# Patient Record
Sex: Female | Born: 1960 | Race: White | Hispanic: No | Marital: Married | Smoking: Never smoker
Health system: Southern US, Community
[De-identification: ages and names within clinical notes are randomized; demographics above are authoritative.]

## PROBLEM LIST (undated history)

## (undated) DIAGNOSIS — G510 Bell's palsy: Secondary | ICD-10-CM

## (undated) HISTORY — DX: Bell's palsy: G51.0

---

## 2008-05-29 ENCOUNTER — Other Ambulatory Visit: Admission: RE | Admit: 2008-05-29 | Discharge: 2008-05-29 | Payer: Self-pay | Admitting: Obstetrics and Gynecology

## 2008-11-06 ENCOUNTER — Other Ambulatory Visit: Admission: RE | Admit: 2008-11-06 | Discharge: 2008-11-06 | Payer: Self-pay | Admitting: Obstetrics and Gynecology

## 2008-12-07 ENCOUNTER — Encounter: Admission: RE | Admit: 2008-12-07 | Discharge: 2008-12-07 | Payer: Self-pay | Admitting: Obstetrics and Gynecology

## 2009-06-05 ENCOUNTER — Other Ambulatory Visit: Admission: RE | Admit: 2009-06-05 | Discharge: 2009-06-05 | Payer: Self-pay | Admitting: Obstetrics and Gynecology

## 2009-12-05 ENCOUNTER — Other Ambulatory Visit: Admission: RE | Admit: 2009-12-05 | Discharge: 2009-12-05 | Payer: Self-pay | Admitting: Obstetrics and Gynecology

## 2009-12-18 ENCOUNTER — Encounter: Admission: RE | Admit: 2009-12-18 | Discharge: 2009-12-18 | Payer: Self-pay | Admitting: Obstetrics and Gynecology

## 2010-11-17 ENCOUNTER — Encounter: Payer: Self-pay | Admitting: Family Medicine

## 2010-11-17 ENCOUNTER — Ambulatory Visit (INDEPENDENT_AMBULATORY_CARE_PROVIDER_SITE_OTHER): Payer: BC Managed Care – PPO | Admitting: Family Medicine

## 2010-11-17 VITALS — BP 117/78 | HR 64 | Ht 67.5 in | Wt 134.0 lb

## 2010-11-17 DIAGNOSIS — M7710 Lateral epicondylitis, unspecified elbow: Secondary | ICD-10-CM

## 2010-11-17 MED ORDER — NITROGLYCERIN 0.2 MG/HR TD PT24: MEDICATED_PATCH | TRANSDERMAL | Status: DC

## 2010-11-17 NOTE — Patient Instructions (Signed)
You have "tennis elbow" --also known as lateral epicondylitis.  We will start you on the nitro patches as we discussed. I would not play tennis until we see you back in 2-3 weeks. I have called the patch into you pharmacy. Please call us with questions or issues in the interim. The instructions for the patch are: Cut patch into one - fourth pieces. Place a one fourth piece of patch on  skin over affected area, changing to a new piece every 24 hours.

## 2010-11-18 DIAGNOSIS — M7710 Lateral epicondylitis, unspecified elbow: Secondary | ICD-10-CM | POA: Insufficient documentation

## 2010-11-18 NOTE — Progress Notes (Signed)
  Subjective:    Patient ID: Valerie Stephens, female    DOB: [birth year], 50 y.o.   MRN: [id number]  HPI  Right lateral elbow pain for 5-6 weeks. Can isolate the specific day it started. Was playing with a college player, he was very windy and they were both heating extremely hard. Toward the end of the work that she started having some elbow pain after she stopped it got worse. She did apply for a few days and then tried it again and was unable to play. She has now taken off approximately 5 weeks without tenderness and it has not improved. She tried to play for 10 minutes day before yesterday was unable. She has never had prior history of any elbow problems and has had no right upper extremity  surgeries.  Review of Systems    denies bruising of the elbow, no numbness or tingling in the arm or hand. Denies fever. Objective:   Physical Exam    GENERAL: Well-developed female no acute distress ELBOW: Right. Tender to palpation at the lateral epicondyles and this reduces her pain. Active range of motion of her long finger well extended reproduces her pain. She has some mild pain with resisted supination. Her grip strength is bilaterally symmetrical. She is distally neurovascularly intact in the right upper extremity. ULTRASOUND: Small amount of fluid seen in the elbow joint. There are no calcifications noted at the origin of the extensor tendon mass. All the tendons appear to be intact without defect or tear. There is no increase in vascularity in this area. The bone cortex looks normal without disruption.    Assessment & Plan:  #1. Right lateral epicondylitis. We discussed options and she both agree we'll try the nitroglycerin patches. She will continue to remain out of tennis and I'll see her back in approximately 2 -3 weeks. She'll also continue icing.

## 2010-12-08 ENCOUNTER — Encounter: Payer: Self-pay | Admitting: Family Medicine

## 2010-12-08 ENCOUNTER — Ambulatory Visit (INDEPENDENT_AMBULATORY_CARE_PROVIDER_SITE_OTHER): Payer: BC Managed Care – PPO | Admitting: Family Medicine

## 2010-12-08 VITALS — BP 102/63 | HR 48

## 2010-12-08 DIAGNOSIS — M7710 Lateral epicondylitis, unspecified elbow: Secondary | ICD-10-CM

## 2010-12-08 NOTE — Patient Instructions (Signed)
Continue the nitro patch for another 4 weeks.  NEXT Monday start playing at about 30% of your previous intensity and frequency.  If you have only mild pain you may increase 30% the next week and again the following week until you are at 90% in 4 weeks. At that time, if you are only having mild pain you may discontinue the patch and continue advancement in  the  Next few weeks.  If at any point you are having significant pain, please RTC sooner.

## 2010-12-08 NOTE — Progress Notes (Signed)
  Subjective:    Patient ID: Valerie Stephens, female    DOB: 06-18-1960, 50 y.o.   MRN: [id number]  HPI  Followup of right tennis elbow. Has been using the nitroglycerin patch without problem. She has not played tennis. Yesterday for the first time she noted that she had no stiffness on extension of the elbow. She feels like she could possibly go out and hit some tennis balls without pain. No new symptoms.  Review of Systems    denies fever. No skin irritation with the nitroglycerin patch Objective:   Physical Exam  GENERAL: Well-developed female no acute distress ELBOW: Right. Mildly tender to palpation at the extensor muscle mass origin. Full range of motion of the elbow. Ultrasound: Compared lateral view of the lips today to the previous she has about 75% less fluid in the elbow joint.      Assessment & Plan:   #1. Right lateral epicondylitis. Continue the vent was repacked for the next 4 weeks. We will give her one more week of rest and then we'll start her back in gradually with activity increasing weekly by 30%. At the end of 3-4 weeks she should be close to 100%. If She has no pain at that time I will discontinue the patch. If She continues to have pain or for worsens in the interim I would have her come back and consider injection therapy. I would continue ice after exercise. She is also obtained an elbow band and think is fine to try that out.

## 2010-12-09 ENCOUNTER — Other Ambulatory Visit (HOSPITAL_COMMUNITY)
Admission: RE | Admit: 2010-12-09 | Discharge: 2010-12-09 | Disposition: A | Discharge status: Home / Self Care | Payer: BC Managed Care – PPO | Source: Ambulatory Visit | Attending: Obstetrics and Gynecology | Admitting: Obstetrics and Gynecology

## 2010-12-09 ENCOUNTER — Other Ambulatory Visit: Payer: Self-pay | Admitting: Obstetrics and Gynecology

## 2010-12-09 DIAGNOSIS — Z01419 Encounter for gynecological examination (general) (routine) without abnormal findings: Secondary | ICD-10-CM | POA: Insufficient documentation

## 2011-10-21 ENCOUNTER — Ambulatory Visit (INDEPENDENT_AMBULATORY_CARE_PROVIDER_SITE_OTHER): Payer: BC Managed Care – PPO | Admitting: Emergency Medicine

## 2011-10-21 VITALS — BP 106/66 | HR 52 | Temp 98.2°F | Resp 16 | Ht 66.0 in | Wt 138.0 lb

## 2011-10-21 DIAGNOSIS — L237 Allergic contact dermatitis due to plants, except food: Secondary | ICD-10-CM

## 2011-10-21 DIAGNOSIS — L253 Unspecified contact dermatitis due to other chemical products: Secondary | ICD-10-CM

## 2011-10-21 MED ORDER — DESONIDE 0.05 % EX CREA: TOPICAL_CREAM | Freq: Two times a day (BID) | CUTANEOUS | Status: AC

## 2011-10-21 NOTE — Progress Notes (Signed)
  Subjective:    Patient ID: Valerie Stephens, female    DOB: 05/22/1960, 51 y.o.   MRN: [id number]  HPI    Review of Systems     Objective:   Physical Exam        Assessment & Plan:   History of Present Illness   Patient Identification Valerie Stephens is a 51 y.o. female.  Patient information was obtained from patient. History/Exam limitations: none. Patient presented to the Emergency Department by private vehicle.  Chief Complaint  Poison Ivy   Patient presents for evaluation of rash on bilateral arm. Onset of symptoms was abrupt starting 3 days ago, and has been stable since that time.. Symptoms include itching, weeping and blisters. Care prior to arrival consisted of OTC ointment, with minimal relief.  No past medical history on file. No family history on file. Current Outpatient Prescriptions  Medication Sig Dispense Refill  . nitroGLYCERIN (NITRODUR - DOSED IN MG/24 HR) 0.2 mg/hr Cut patch into one - fourth pieces. Place a one fourth piece of patch on  skin over affected area, changing to a new piece every 24 hours.  30 patch  1   No Known Allergies History   Social History  . Marital Status: Unknown    Spouse Name: N/A    Number of Children: N/A  . Years of Education: N/A   Occupational History  . Not on file.   Social History Main Topics  . Smoking status: Never Smoker   . Smokeless tobacco: Never Used  . Alcohol Use: Not on file  . Drug Use: Not on file  . Sexually Active: Not on file   Other Topics Concern  . Not on file   Social History Narrative  . No narrative on file   Review of Systems A comprehensive review of systems was negative.   Physical Exam   BP 106/66  Pulse 52  Temp 98.2 F (36.8 C) (Oral)  Resp 16  Ht 5\' 6"  (1.676 m)  Wt 138 lb (62.596 kg)  BMI 22.27 kg/m2  LMP 10/10/2011 General appearance: alert, cooperative, appears stated age and no distress Head: Normocephalic, without obvious abnormality, atraumatic Eyes:  conjunctivae/corneas clear. PERRL, EOM's intact. Fundi benign. Neck: no adenopathy, no carotid bruit and supple, symmetrical, trachea midline Extremities: extremities normal, atraumatic, no cyanosis or edema Skin: rash characteristic of poison ivy on both wrists  ED Course   Studies: None indicated.  Records Reviewed: Old medical records.  Treatments: Steroids topically given.  Consultations:none Disposition: Home Advised to return for worsening or additional problems such as abdominal or chest pain

## 2011-10-23 ENCOUNTER — Telehealth: Payer: Self-pay

## 2011-10-23 NOTE — Telephone Encounter (Signed)
PT STATES SHE WAS SEEN FOR POISON IVY AND IT ISN'T GETTING ANY BETTER, WOULD LIKE TO SPEAK WITH SOMEONE ABOUT GETTING A SHOT SO SHE CAN JUST BE DONE WITH IT. PLEASE CALL [phone]

## 2011-10-23 NOTE — Telephone Encounter (Signed)
PT CALLED AND STATED SHE IS GETTING NO RELIEF WITH THE CREAM THAT WAS GIVEN TO HER.  CAN WE CALL IN SOME PREDNISONE?

## 2011-10-23 NOTE — Telephone Encounter (Signed)
Dr. Dareen Piano do you want to call in Prednisone.

## 2011-10-25 ENCOUNTER — Other Ambulatory Visit: Payer: Self-pay | Admitting: *Deleted

## 2011-10-25 ENCOUNTER — Ambulatory Visit (INDEPENDENT_AMBULATORY_CARE_PROVIDER_SITE_OTHER): Payer: BC Managed Care – PPO | Admitting: Physician Assistant

## 2011-10-25 DIAGNOSIS — L237 Allergic contact dermatitis due to plants, except food: Secondary | ICD-10-CM

## 2011-10-25 DIAGNOSIS — L255 Unspecified contact dermatitis due to plants, except food: Secondary | ICD-10-CM

## 2011-10-25 MED ORDER — METHYLPREDNISOLONE ACETATE 80 MG/ML IJ SUSP
80.0000 mg | Freq: Once | INTRAMUSCULAR | Status: AC
  Administered 2011-10-25: 80 mg via INTRAMUSCULAR

## 2011-10-25 NOTE — Telephone Encounter (Signed)
PT WILL COME IN FOR SHOT.   SHOT ORDERED

## 2011-10-25 NOTE — Telephone Encounter (Signed)
Advise patient to come in for depo medrol 80  Mg IM will not require office visit.  Thanks

## 2012-01-20 ENCOUNTER — Other Ambulatory Visit: Payer: Self-pay | Admitting: Obstetrics and Gynecology

## 2012-01-20 DIAGNOSIS — Z1231 Encounter for screening mammogram for malignant neoplasm of breast: Secondary | ICD-10-CM

## 2012-01-26 ENCOUNTER — Ambulatory Visit
Admission: RE | Admit: 2012-01-26 | Discharge: 2012-01-26 | Disposition: A | Discharge status: Home / Self Care | Payer: BC Managed Care – PPO | Source: Ambulatory Visit | Attending: Obstetrics and Gynecology | Admitting: Obstetrics and Gynecology

## 2012-01-26 DIAGNOSIS — Z1231 Encounter for screening mammogram for malignant neoplasm of breast: Secondary | ICD-10-CM

## 2012-02-03 NOTE — Progress Notes (Signed)
Injection only

## 2012-04-11 ENCOUNTER — Other Ambulatory Visit: Payer: Self-pay | Admitting: Dermatology

## 2013-02-16 ENCOUNTER — Other Ambulatory Visit: Payer: Self-pay

## 2013-02-16 DIAGNOSIS — Z1231 Encounter for screening mammogram for malignant neoplasm of breast: Secondary | ICD-10-CM

## 2013-03-14 ENCOUNTER — Ambulatory Visit
Admission: RE | Admit: 2013-03-14 | Discharge: 2013-03-14 | Disposition: A | Discharge status: Home / Self Care | Payer: BC Managed Care – PPO | Source: Ambulatory Visit

## 2013-03-14 DIAGNOSIS — Z1231 Encounter for screening mammogram for malignant neoplasm of breast: Secondary | ICD-10-CM

## 2013-04-10 ENCOUNTER — Other Ambulatory Visit: Payer: Self-pay | Admitting: Dermatology

## 2013-04-24 ENCOUNTER — Other Ambulatory Visit: Payer: Self-pay | Admitting: Dermatology

## 2014-08-08 ENCOUNTER — Other Ambulatory Visit: Payer: Self-pay | Admitting: Obstetrics and Gynecology

## 2014-08-09 LAB — CYTOLOGY - PAP

## 2015-12-20 ENCOUNTER — Ambulatory Visit (INDEPENDENT_AMBULATORY_CARE_PROVIDER_SITE_OTHER): Payer: BC Managed Care – PPO | Admitting: Family Medicine

## 2015-12-20 ENCOUNTER — Encounter: Payer: Self-pay | Admitting: Family Medicine

## 2015-12-20 DIAGNOSIS — M25562 Pain in left knee: Secondary | ICD-10-CM | POA: Diagnosis not present

## 2015-12-20 MED ORDER — METHYLPREDNISOLONE ACETATE 40 MG/ML IJ SUSP
10.0000 mg | Freq: Once | INTRAMUSCULAR | Status: AC
  Administered 2015-12-20: 10 mg via INTRA_ARTICULAR

## 2015-12-20 NOTE — Assessment & Plan Note (Signed)
We discussed etiology, typical course, options for further evaluation and/or treatment. Ultimately she decided to proceed with corticosteroid injection today. If she's not improving then I would recommend MRI she may have meniscal tear despite the fact that she's not having any locking or catching. She wants to remain active. I would have her give us a call in 7-14 days and let us know one way or another if she has improved.

## 2015-12-20 NOTE — Progress Notes (Signed)
  Valerie FriskDeanne Crooke - 55 y.o. female MRN [id number]  Date of birth: 08/21/1960    SUBJECTIVE:     Chief Complaint: Left knee pain  HPI: 3 months ago she had some knee pain and swelling after playing indeterminate. Recalls no specific injury just at the knee became more stiff and painful on the third day of the term. She then rested it over the next 6-8 weeks, playing little tenderness. 2 days ago she went out to play tennis again and had pain that night and noticed a lot of swelling. The swelling has not gone down and the pain has not improved. Pain 4 out of 10. Stiff.  No prior history of knee injury or surgeries ROS:     No unusual weight change, fever, sweats, chills. No calf pain. No other unusual joint or muscle pains.  PERTINENT  PMH / PSH FH / / SH:  Past Medical, Surgical, Social, and Family History Reviewed & Updated in the EMR.  Pertinent findings include:  No history of knee surgeries No personal history diabetes mellitus  OBJECTIVE: BP 111/62   Pulse 82   Ht 5\' 6"  (1.676 m)   Wt 134 lb (60.8 kg)   BMI 21.63 kg/m   Physical Exam:  Vital signs are reviewed. GEN.: Well-developed female no acute distress KNEES: Asymmetrical with left knee revealing medium sized effusion. LEFT KNEE: No erythema. Full range of motion flexion extension. No true joint line tenderness on either lateral or medial side. No tenderness to palpation along the quadricep patellar tendon. Intact to varus and valgus stress without any pain. Normal Lockman. Normal McMurray elbow this exam is uncomfortable mostly from   full flexion of her knee. VASCULAR: Dorsalis pedis and posterior tibialis pulses are 2+ bilaterally equal. Neuro: Intact sensation soft touch bilateral feet. Normal gait.  Ultrasound: Patellar and quadricep tendon are normal. There is a small amount of fluid in the suprapatellar pouch area the medial meniscus has an irregularity in the mid substance that could be consistent with tear. The lateral  meniscus looks slightly frayed but is otherwise intact. There are osteophytes at both medial and lateral joint lines.  INJECTION: Patient was given informed consent, signed copy in the chart. Appropriate time out was taken. Area prepped and draped in usual sterile fashion. 1 cc of methylprednisolone 40 mg/ml plus  4 cc of 1% lidocaine without epinephrine was injected into the left knee using a(n) anterior medial approach. The patient tolerated the procedure well. There were no complications. Post procedure instructions were given.    ASSESSMENT & PLAN:  See problem based charting & AVS for pt instructions.

## 2016-01-31 ENCOUNTER — Other Ambulatory Visit: Payer: Self-pay | Admitting: Orthopedic Surgery

## 2016-01-31 DIAGNOSIS — S83242A Other tear of medial meniscus, current injury, left knee, initial encounter: Secondary | ICD-10-CM

## 2016-02-17 ENCOUNTER — Ambulatory Visit
Admission: RE | Admit: 2016-02-17 | Discharge: 2016-02-17 | Disposition: A | Discharge status: Home / Self Care | Payer: BC Managed Care – PPO | Source: Ambulatory Visit | Attending: Orthopedic Surgery | Admitting: Orthopedic Surgery

## 2016-02-17 DIAGNOSIS — S83242A Other tear of medial meniscus, current injury, left knee, initial encounter: Secondary | ICD-10-CM

## 2017-06-24 ENCOUNTER — Ambulatory Visit (INDEPENDENT_AMBULATORY_CARE_PROVIDER_SITE_OTHER): Payer: BC Managed Care – PPO | Admitting: Orthopedic Surgery

## 2017-09-02 ENCOUNTER — Encounter: Payer: Self-pay | Admitting: Sports Medicine

## 2017-09-02 ENCOUNTER — Ambulatory Visit: Payer: BC Managed Care – PPO | Admitting: Sports Medicine

## 2017-09-02 DIAGNOSIS — G8929 Other chronic pain: Secondary | ICD-10-CM | POA: Diagnosis not present

## 2017-09-02 DIAGNOSIS — M25562 Pain in left knee: Secondary | ICD-10-CM | POA: Diagnosis not present

## 2017-09-02 DIAGNOSIS — M1732 Unilateral post-traumatic osteoarthritis, left knee: Secondary | ICD-10-CM

## 2017-09-02 DIAGNOSIS — M1712 Unilateral primary osteoarthritis, left knee: Secondary | ICD-10-CM | POA: Insufficient documentation

## 2017-09-02 NOTE — Assessment & Plan Note (Signed)
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 I explained that that is 1 of the risks for individuals over the age of 57  We discussed a conservative approach Icing much more frequently and get a compression sleeve Compression sleeves were walking or activity Continue active strength exercises for her hip abductors quadriceps and hamstrings She should alternate weightbearing exercise with more biking and nonweightbearing exercise Avoid deep knee bend positions in yoga  She can try supplements such as turmeric  I am happy to see her again as needed  I do not feel that tetanus is totally contraindicated but she will need to use bracing and support while playing

## 2017-09-02 NOTE — Progress Notes (Signed)
CC; Left knee pain  Patient is a high-level tennis athlete  she began having some left knee pain in 2016 In 2017 she had arthroscopy by Dr. Luiz BlareGraves for a lateral meniscus tear She continued to have some persistent pain and clicking in the left knee in the next 2 years since her surgery Dr. Luiz BlareGraves has given her injections and has followed her with standing x-rays She shows a decrease in the lateral joint space An MRI showed some mild arthritic changes in 3 areas of the knee  She comes for my opinion because she is never really been able to return to tennis or some of her other activities as effectively  Past medical history I reviewed her notes from Dr. Luiz BlareGraves and they primarily concerned this knee and progressive arthritic change following meniscectomy She has seen Dr. Jennette KettleNeal in the past for lateral epicondylitis and on one occasion for the left knee pain  Review of systems No locking No giving way Increased pain with walking down stairs or steps  Physical examination BP 111/72   Pulse 64   Ht 5' 7.75" (1.721 m)   Wt 126 lb (57.2 kg)   BMI 19.30 kg/m   Thin athletic female who is in no acute distress  RT knee Knee: Normal to inspection with no erythema or effusion or obvious bony abnormalities. Palpation normal with no warmth or joint line tenderness or patellar tenderness or condyle tenderness. ROM normal in flexion and extension and lower leg rotation. Ligaments with solid consistent endpoints including ACL, PCL, LCL, MCL. Negative Mcmurray's and provocative meniscal tests. Non painful patellar compression. Patellar and quadriceps tendons unremarkable. Hamstring and quadriceps strength is normal. Note flexion to 160 deg  Left Knee There is an effusion in the suprapatellar pouch There is clicking and crepitation along the lateral joint line Mild pain on McMurray's testing Flexion is 20 degrees less but is still 140 degrees Extension is full No bony hypertrophy or  spurring noted  Strength testing reveals excellent quadriceps hamstring and hip abductor strength  I reviewed her previous MRI and there is only mild arthritic change but more degenerative meniscus changes even some along the medial side

## 2017-09-02 NOTE — Assessment & Plan Note (Signed)
This never really resolved satisfactorally even after the meniscus surgery  Uses ice sometimes NSAIDs sometimes

## 2020-12-19 ENCOUNTER — Other Ambulatory Visit: Payer: Self-pay

## 2020-12-19 ENCOUNTER — Emergency Department (HOSPITAL_COMMUNITY): Payer: BC Managed Care – PPO

## 2020-12-19 ENCOUNTER — Encounter (HOSPITAL_COMMUNITY): Payer: Self-pay

## 2020-12-19 ENCOUNTER — Emergency Department (HOSPITAL_COMMUNITY)
Admission: EM | Admit: 2020-12-19 | Discharge: 2020-12-19 | Disposition: A | Discharge status: Home / Self Care | Payer: BC Managed Care – PPO | Attending: Emergency Medicine | Admitting: Emergency Medicine

## 2020-12-19 DIAGNOSIS — Y900 Blood alcohol level of less than 20 mg/100 ml: Secondary | ICD-10-CM | POA: Diagnosis not present

## 2020-12-19 DIAGNOSIS — Z79899 Other long term (current) drug therapy: Secondary | ICD-10-CM | POA: Diagnosis not present

## 2020-12-19 DIAGNOSIS — R791 Abnormal coagulation profile: Secondary | ICD-10-CM | POA: Diagnosis not present

## 2020-12-19 DIAGNOSIS — G510 Bell's palsy: Secondary | ICD-10-CM | POA: Diagnosis not present

## 2020-12-19 DIAGNOSIS — R29810 Facial weakness: Secondary | ICD-10-CM | POA: Diagnosis present

## 2020-12-19 LAB — URINALYSIS, ROUTINE W REFLEX MICROSCOPIC
Bilirubin Urine: NEGATIVE
Glucose, UA: NEGATIVE mg/dL
Hgb urine dipstick: NEGATIVE
Ketones, ur: NEGATIVE mg/dL
Leukocytes,Ua: NEGATIVE
Nitrite: NEGATIVE
Protein, ur: NEGATIVE mg/dL
Specific Gravity, Urine: 1.006 (ref 1.005–1.030)
pH: 5 (ref 5.0–8.0)

## 2020-12-19 LAB — COMPREHENSIVE METABOLIC PANEL
ALT: 16 U/L (ref 0–44)
AST: 17 U/L (ref 15–41)
Albumin: 4.4 g/dL (ref 3.5–5.0)
Alkaline Phosphatase: 47 U/L (ref 38–126)
Anion gap: 7 (ref 5–15)
BUN: 14 mg/dL (ref 6–20)
CO2: 27 mmol/L (ref 22–32)
Calcium: 9.9 mg/dL (ref 8.9–10.3)
Chloride: 102 mmol/L (ref 98–111)
Creatinine, Ser: 0.8 mg/dL (ref 0.44–1.00)
GFR, Estimated: >60 mL/min (ref >60)
Glucose, Bld: 127 mg/dL — ABNORMAL HIGH (ref 70–99)
Potassium: 4 mmol/L (ref 3.5–5.1)
Sodium: 136 mmol/L (ref 135–145)
Total Bilirubin: 0.7 mg/dL (ref 0.3–1.2)
Total Protein: 7.1 g/dL (ref 6.5–8.1)

## 2020-12-19 LAB — CBC
HCT: 42.3 % (ref 36.0–46.0)
Hemoglobin: 14.3 g/dL (ref 12.0–15.0)
MCH: 32.1 pg (ref 26.0–34.0)
MCHC: 33.8 g/dL (ref 30.0–36.0)
MCV: 94.8 fL (ref 80.0–100.0)
Platelets: 201 10*3/uL (ref 150–400)
RBC: 4.46 MIL/uL (ref 3.87–5.11)
RDW: 11.8 % (ref 11.5–15.5)
WBC: 5.1 10*3/uL (ref 4.0–10.5)
nRBC: 0 % (ref 0.0–0.2)

## 2020-12-19 LAB — PROTIME-INR
INR: 1 (ref 0.8–1.2)
Prothrombin Time: 13.5 seconds (ref 11.4–15.2)

## 2020-12-19 LAB — DIFFERENTIAL
Abs Immature Granulocytes: 0.02 10*3/uL (ref 0.00–0.07)
Basophils Absolute: 0 10*3/uL (ref 0.0–0.1)
Basophils Relative: 1 %
Eosinophils Absolute: 0 10*3/uL (ref 0.0–0.5)
Eosinophils Relative: 1 %
Immature Granulocytes: 0 %
Lymphocytes Relative: 32 %
Lymphs Abs: 1.6 10*3/uL (ref 0.7–4.0)
Monocytes Absolute: 0.4 10*3/uL (ref 0.1–1.0)
Monocytes Relative: 9 %
Neutro Abs: 2.9 10*3/uL (ref 1.7–7.7)
Neutrophils Relative %: 57 %

## 2020-12-19 LAB — APTT: aPTT: 30 seconds (ref 24–36)

## 2020-12-19 LAB — RAPID URINE DRUG SCREEN, HOSP PERFORMED
Amphetamines: NOT DETECTED
Barbiturates: NOT DETECTED
Benzodiazepines: NOT DETECTED
Cocaine: NOT DETECTED
Opiates: NOT DETECTED
Tetrahydrocannabinol: NOT DETECTED

## 2020-12-19 LAB — ETHANOL: Alcohol, Ethyl (B): <10 mg/dL (ref <10)

## 2020-12-19 LAB — I-STAT CHEM 8, ED
BUN: 15 mg/dL (ref 6–20)
Calcium, Ion: 1.3 mmol/L (ref 1.15–1.40)
Chloride: 102 mmol/L (ref 98–111)
Creatinine, Ser: 0.8 mg/dL (ref 0.44–1.00)
Glucose, Bld: 121 mg/dL — ABNORMAL HIGH (ref 70–99)
HCT: 42 % (ref 36.0–46.0)
Hemoglobin: 14.3 g/dL (ref 12.0–15.0)
Potassium: 3.9 mmol/L (ref 3.5–5.1)
Sodium: 139 mmol/L (ref 135–145)
TCO2: 27 mmol/L (ref 22–32)

## 2020-12-19 MED ORDER — PREDNISONE 20 MG PO TABS: 40.0000 mg | ORAL_TABLET | Freq: Every day | ORAL | 0 refills | Status: AC

## 2020-12-19 MED ORDER — ACYCLOVIR 400 MG PO TABS: 400.0000 mg | ORAL_TABLET | Freq: Four times a day (QID) | ORAL | 0 refills | Status: AC

## 2020-12-19 NOTE — Discharge Instructions (Addendum)
Your exam and testing is consistent with what is called a Bell's palsy, please read the attached instructions.  Please take the following medications   Prednisone  Acyclovir  These call the office of the neurologist listed above or the neurologist of your choice, you should be seen within the next month, return to the emergency department if you should develop severe or worsening symptoms including any symptoms of your arms legs changes in vision or speech or coordination.  Thank you for letting us take care of you today!  Please obtain all of your results from medical records or have your doctors office obtain the results - share them with your doctor - you should be seen at your doctors office in the next 2 days. Call today to arrange your follow up. Take the medications as prescribed. Please review all of the medicines and only take them if you do not have an allergy to them. Please be aware that if you are taking birth control pills, taking other prescriptions, ESPECIALLY ANTIBIOTICS may make the birth control ineffective - if this is the case, either do not engage in sexual activity or use alternative methods of birth control such as condoms until you have finished the medicine and your family doctor says it is OK to restart them. If you are on a blood thinner such as COUMADIN, be aware that any other medicine that you take may cause the coumadin to either work too much, or not enough - you should have your coumadin level rechecked in next 7 days if this is the case.  ?  It is also a possibility that you have an allergic reaction to any of the medicines that you have been prescribed - Everybody reacts differently to medications and while MOST people have no trouble with most medicines, you may have a reaction such as nausea, vomiting, rash, swelling, shortness of breath. If this is the case, please stop taking the medicine immediately and contact your physician.   If you were given a medication in  the ED such as percocet, vicodin, or morphine, be aware that these medicines are sedating and may change your ability to take care of yourself adequately for several hours after being given this medicines - you should not drive or take care of small children if you were given this medicine in the Emergency Department or if you have been prescribed these types of medicines. ?   You should return to the ER IMMEDIATELY if you develop severe or worsening symptoms.

## 2020-12-19 NOTE — ED Provider Notes (Signed)
[facility] EMERGENCY DEPARTMENT Provider Note   CSN: [id number] Arrival date & time: 12/19/20  6967     History Chief Complaint  Patient presents with   Facial Droop    Valerie Stephens is a 60 y.o. female.  HPI    60 y/o female - noted her face to droop yesterday while she was at home, she had some difficulty with closing her right eyelid, she had some difficulty brushing her teeth last night, she states that her taste is changed and her tongue feels a little bit swollen, no rashes, no tick bites, no exposure to Lyme's disease, minimal headache, she does not have any other medical problems and takes no daily medicines does not smoke cigarettes she does drink 1 glass of wine per day.  Her symptoms have been persistent, nothing seems to make it better or worse.  History reviewed. No pertinent past medical history.  Patient Active Problem List   Diagnosis Date Noted   Osteoarthritis of left knee 09/02/2017   Left knee pain 12/20/2015   Epicondylitis, lateral (tennis elbow) 11/18/2010    History reviewed. No pertinent surgical history.   OB History   No obstetric history on file.     No family history on file.  Social History   Tobacco Use   Smoking status: Never   Smokeless tobacco: Never    Home Medications Prior to Admission medications   Medication Sig Start Date End Date Taking? Authorizing Provider  acyclovir (ZOVIRAX) 400 MG tablet Take 1 tablet (400 mg total) by mouth 4 (four) times daily for 7 days. 12/19/20 12/26/20 Yes Eber Hong, MD  predniSONE (DELTASONE) 20 MG tablet Take 2 tablets (40 mg total) by mouth daily for 10 days. 12/19/20 12/29/20 Yes Eber Hong, MD    Allergies    Patient has no known allergies.  Review of Systems   Review of Systems  All other systems reviewed and are negative.  Physical Exam Updated Vital Signs BP 116/70   Pulse (!) 58   Temp 98.4 F (36.9 C) (Temporal)   Resp 18   Ht 1.727 m (5\' 8" )   Wt 56.7  kg   SpO2 100%   BMI 19.01 kg/m   Physical Exam Vitals and nursing note reviewed.  Constitutional:      General: She is not in acute distress.    Appearance: She is well-developed.  HENT:     Head: Normocephalic and atraumatic.     Mouth/Throat:     Pharynx: No oropharyngeal exudate.  Eyes:     General: No scleral icterus.       Right eye: No discharge.        Left eye: No discharge.     Conjunctiva/sclera: Conjunctivae normal.     Pupils: Pupils are equal, round, and reactive to light.  Neck:     Thyroid: No thyromegaly.     Vascular: No JVD.  Cardiovascular:     Rate and Rhythm: Normal rate and regular rhythm.     Heart sounds: Normal heart sounds. No murmur heard.   No friction rub. No gallop.  Pulmonary:     Effort: Pulmonary effort is normal. No respiratory distress.     Breath sounds: Normal breath sounds. No wheezing or rales.  Abdominal:     General: Bowel sounds are normal. There is no distension.     Palpations: Abdomen is soft. There is no mass.     Tenderness: There is no abdominal tenderness.  Musculoskeletal:  General: No tenderness. Normal range of motion.     Cervical back: Normal range of motion and neck supple.  Lymphadenopathy:     Cervical: No cervical adenopathy.  Skin:    General: Skin is warm and dry.     Findings: No erythema or rash.  Neurological:     Mental Status: She is alert.     Coordination: Coordination normal.     Comments: The patient has right-sided facial droop, she is unable to close her right eyelid, her right forehead does not rise with looking up.  Speech is totally normal, coordination is totally normal, strength is totally normal in all 4 extremities.  Cranial nerves III through XII are otherwise normal except for the facial  Psychiatric:        Behavior: Behavior normal.    ED Results / Procedures / Treatments   Labs (all labs ordered are listed, but only abnormal results are displayed) Labs Reviewed   COMPREHENSIVE METABOLIC PANEL - Abnormal; Notable for the following components:      Result Value   Glucose, Bld 127 (*)    All other components within normal limits  URINALYSIS, ROUTINE W REFLEX MICROSCOPIC - Abnormal; Notable for the following components:   Color, Urine STRAW (*)    All other components within normal limits  I-STAT CHEM 8, ED - Abnormal; Notable for the following components:   Glucose, Bld 121 (*)    All other components within normal limits  ETHANOL  PROTIME-INR  APTT  CBC  DIFFERENTIAL  RAPID URINE DRUG SCREEN, HOSP PERFORMED    EKG EKG Interpretation  Date/Time:  Thursday December 19 2020 10:09:39 EDT Ventricular Rate:  67 PR Interval:  162 QRS Duration: 86 QT Interval:  436 QTC Calculation: 460 R Axis:   73 Text Interpretation: Normal sinus rhythm with sinus arrhythmia Cannot rule out Anterior infarct , age undetermined Abnormal ECG No old tracing to compare Confirmed by Eber Hong ([id number]) on 12/19/2020 3:33:38 PM  Radiology CT HEAD WO CONTRAST  Result Date: 12/19/2020 CLINICAL DATA:  Neurological deficit acute suspected stroke, forehead sparing facial weakness, heaviness of tongue, RIGHT eye blurred vision, onset of symptoms yesterday EXAM: CT HEAD WITHOUT CONTRAST TECHNIQUE: Contiguous axial images were obtained from the base of the skull through the vertex without intravenous contrast. Sagittal and coronal MPR images reconstructed from axial data set. COMPARISON:  None FINDINGS: Brain: Normal ventricular morphology. No midline shift or mass effect. Normal appearance of brain parenchyma. No intracranial hemorrhage, mass lesion, evidence of acute infarction, or extra-axial fluid collection. Vascular: No hyperdense vessels Skull: Intact Sinuses/Orbits: Clear Other: N/A IMPRESSION: Normal exam. Electronically Signed   By: Ulyses Southward M.D.   On: 12/19/2020 13:02    Procedures Procedures   Medications Ordered in ED Medications - No data to display  ED  Course  I have reviewed the triage vital signs and the nursing notes.  Pertinent labs & imaging results that were available during my care of the patient were reviewed by me and considered in my medical decision making (see chart for details).    MDM Rules/Calculators/A&P                           The patient's EKG is totally unremarkable, her CT scan of the brain is unremarkable, labs as ordered before I saw the patient are also unremarkable and show no acute findings.  Her exam is consistent with having a Bell palsy, she will  be treated with both prednisone and acyclovir, she is agreeable to the plan, she will be referred to neurology, she will be encouraged to use artificial tears to help with lubricating the right eye.  I doubt that this is a stroke given the focal cranial nerve VII deficits  Final Clinical Impression(s) / ED Diagnoses Final diagnoses:  Bell palsy    Rx / DC Orders ED Discharge Orders          Ordered    predniSONE (DELTASONE) 20 MG tablet  Daily        12/19/20 1548    acyclovir (ZOVIRAX) 400 MG tablet  4 times daily        12/19/20 1548    Ambulatory referral to Neurology       Comments: An appointment is requested in approximately: 4 weeks   12/19/20 1550             Eber Hong, MD 12/19/20 1550

## 2020-12-19 NOTE — ED Triage Notes (Signed)
Pt reports R eye vision changes starting yesterday at noon with headache. States that she feels like her R side of her face is more droopy. No other deficits noted.

## 2020-12-19 NOTE — ED Provider Notes (Signed)
Emergency Medicine Provider Triage Evaluation Note  Valerie Stephens , a 60 y.o. female  was evaluated in triage.  Pt complains of right-sided facial weakness.  Dates that yesterday at about 30 AM she started noticing that the right side of her face felt a little bit droopy.  She states that she had some vision changes.  She took her contacts out which did not improve it.  She feels like her head is hurting also.  She denies any fevers.  No numbness or weakness in the arms or legs noted.  No recent infections, surgeries or vaccinations. Review of Systems  Positive: Right facial droop.  Negative: Arm or leg weakness.   Physical Exam  BP 117/75 (BP Location: Left Arm)   Pulse 62   Temp 97.9 F (36.6 C) (Oral)   Resp 14   Ht 5\' 8"  (1.727 m)   Wt 56.7 kg   SpO2 100%   BMI 19.01 kg/m  Gen:   Awake, no distress   Resp:  Normal effort  MSK:   Moves extremities without difficulty, normal gait with out ataxia.  Other:  Patient has right-sided facial droop involving the right sided mid and lower face.  This appears to be forehead sparing as she is able to raise her eyebrows bilaterally and has movement of her forehead at this time.  Mild right ptosis.  Right-sided flattening of nasolabial lines.  5/5 strength bilateral arms and legs. Speech is not slurred.  Medical Decision Making  Medically screening exam initiated at 10:45 AM.  Appropriate orders placed.  Valerie Stephens was informed that the remainder of the evaluation will be completed by another provider, this initial triage assessment does not replace that evaluation, and the importance of remaining in the ED until their evaluation is complete.  Patient is approximately 24 hours from onset of right-sided facial droop.  On my exam this appears to be forehead sparing as she is able to move her right eyebrow and raising her eyebrows causes symmetrical creases on both sides of her forehead without evidence of weakness.  Given that she also has a headache  concerning for possible cause other than Bell's palsy.  She does not meet LVO criteria and is about 24 hours from the onset.  Stroke orders placed without calling code stroke.  Note: Portions of this report may have been transcribed using voice recognition software. Every effort was made to ensure accuracy; however, inadvertent computerized transcription errors may be present    Storm Frisk 12/19/20 1054    02/18/21, MD 12/19/20 1157

## 2021-01-22 ENCOUNTER — Ambulatory Visit: Payer: BC Managed Care – PPO | Admitting: Neurology

## 2021-01-22 ENCOUNTER — Encounter: Payer: Self-pay | Admitting: Neurology

## 2021-01-22 VITALS — BP 108/64 | HR 54 | Ht 67.75 in | Wt 127.0 lb

## 2021-01-22 DIAGNOSIS — G510 Bell's palsy: Secondary | ICD-10-CM | POA: Diagnosis not present

## 2021-01-22 DIAGNOSIS — H93231 Hyperacusis, right ear: Secondary | ICD-10-CM | POA: Diagnosis not present

## 2021-01-22 NOTE — Progress Notes (Signed)
[facility]  PATIENT: Valerie Stephens DOB: 1960-06-21  REFERRING CLINICIAN: Eber Hong, MD HISTORY FROM: Patient  REASON FOR VISIT: Bell's Palsy    HISTORICAL  CHIEF COMPLAINT:  Chief Complaint  Patient presents with   Bells Palsy    New patient: internal Referral: HFU for bells palsy Room 13, alone in room    HISTORY OF PRESENT ILLNESS:  This is a 60 year old woman with no past medical history who is presenting after hospital visit for Bell's palsy.  Patient reported she woke up the morning of August 11 with right facial weakness, she felt a strange feeling around right side of her mouth, right I will was feeling like she could not put on the contact and also a low-grade headache.  She went to the ED when she was diagnosed with right facial paralysis.  She had a noncontrast head CT which was negative for any acute intracranial abnormality.  She was diagnosed with Bell's palsy and started on prednisone and acyclovir.  She completed a course and states that symptoms took about 3 weeks to resolve.  She is almost back to baseline but does complain of right-sided hyperacusis.  Stated that her taste is back to normal and she is able to close her eyes very tight.  No other complaint, no other concerns, no other issues.   OTHER MEDICAL CONDITIONS: None    REVIEW OF SYSTEMS: Full 14 system review of systems performed and negative with exception of: As noted in the HPI  ALLERGIES: No Known Allergies  HOME MEDICATIONS: Outpatient Medications Prior to Visit  Medication Sig Dispense Refill   calcium-vitamin D (OSCAL WITH D) 250-125 MG-UNIT tablet Take 1 tablet by mouth 2 (two) times daily.     No facility-administered medications prior to visit.    PAST MEDICAL HISTORY: Past Medical History:  Diagnosis Date   Bell's palsy     PAST SURGICAL HISTORY: History reviewed. No pertinent surgical history.  FAMILY HISTORY: History reviewed. No pertinent family  history.  SOCIAL HISTORY: Social History   Socioeconomic History   Marital status: Married    Spouse name: Onalee Hua   Number of children: Not on file   Years of education: Not on file   Highest education level: Not on file  Occupational History   Not on file  Tobacco Use   Smoking status: Never   Smokeless tobacco: Never  Substance and Sexual Activity   Alcohol use: Yes    Alcohol/week: 1.0 standard drink    Types: 1 Glasses of wine per week   Drug use: Never   Sexual activity: Not on file  Other Topics Concern   Not on file  Social History Narrative   Lives with Husband in home   Right handed   Drinks 2-3 cups caffeine daily   Social Determinants of Health   Financial Resource Strain: Not on file  Food Insecurity: Not on file  Transportation Needs: Not on file  Physical Activity: Not on file  Stress: Not on file  Social Connections: Not on file  Intimate Partner Violence: Not on file     PHYSICAL EXAM  GENERAL EXAM/CONSTITUTIONAL: Vitals:  Vitals:   01/22/21 1251  BP: 108/64  Pulse: (!) 54  Weight: 127 lb (57.6 kg)  Height: 5' 7.75" (1.721 m)   Body mass index is 19.45 kg/m. Wt Readings from Last 3 Encounters:  01/22/21 127 lb (57.6 kg)  12/19/20 125 lb (56.7 kg)  09/02/17 126 lb (57.2 kg)   Patient is  in no distress; well developed, nourished and groomed; neck is supple   EYES: Pupils round and reactive to light, Visual fields full to confrontation, Extraocular movements intacts,   MUSCULOSKELETAL: Gait, strength, tone, movements noted in Neurologic exam below  NEUROLOGIC: MENTAL STATUS:  awake, alert, oriented to person, place and time recent and remote memory intact normal attention and concentration language fluent, comprehension intact, naming intact fund of knowledge appropriate  CRANIAL NERVE:  2nd, 3rd, 4th, 6th - pupils equal and reactive to light, visual fields full to confrontation, extraocular muscles intact, no nystagmus 5th -  facial sensation symmetric 7th - There is very mild right lower facial droop (corner of her lip) 8th - hyperacusis on the right 9th - palate elevates symmetrically, uvula midline 11th - shoulder shrug symmetric 12th - tongue protrusion midline  MOTOR:  normal bulk and tone, full strength in the BUE, BLE  SENSORY:  normal and symmetric to light touch, pinprick, temperature, vibration  GAIT/STATION:  normal   DIAGNOSTIC DATA (LABS, IMAGING, TESTING) - I reviewed patient records, labs, notes, testing and imaging myself where available.  Lab Results  Component Value Date   WBC 5.1 12/19/2020   HGB 14.3 12/19/2020   HCT 42.0 12/19/2020   MCV 94.8 12/19/2020   PLT 201 12/19/2020      Component Value Date/Time   NA 139 12/19/2020 1151   K 3.9 12/19/2020 1151   CL 102 12/19/2020 1151   CO2 27 12/19/2020 1049   GLUCOSE 121 (H) 12/19/2020 1151   BUN 15 12/19/2020 1151   CREATININE 0.80 12/19/2020 1151   CALCIUM 9.9 12/19/2020 1049   PROT 7.1 12/19/2020 1049   ALBUMIN 4.4 12/19/2020 1049   AST 17 12/19/2020 1049   ALT 16 12/19/2020 1049   ALKPHOS 47 12/19/2020 1049   BILITOT 0.7 12/19/2020 1049   GFRNONAA >60 12/19/2020 1049   No results found for: CHOL, HDL, LDLCALC, LDLDIRECT, TRIG, CHOLHDL No results found for: WFUX3A No results found for: VITAMINB12 No results found for: TSH  Head CT 12/19/2020: Normal exam.     ASSESSMENT AND PLAN  60 y.o. year old female with with no reported past medical history who presented to the ED on August 11 for right facial weakness and was diagnosed with with Bell's palsy.  Patient was treated with acyclovir and prednisone with resolution of her symptoms 3 weeks later.  She is presenting today for follow-up.  On exam there is very mild right lower facial weakness (corner of mouth) and right-sided hyperacusis otherwise rest of her neurological exam is normal.  No further testing or treatment required at this time.  All questions answered.   Patient to follow-up with her primary care doctor and return if worse.   1. Bell's palsy   2. Hyperacusis of right ear      PLAN: Follow up with your primary care doctor Return if worse   No orders of the defined types were placed in this encounter.   No orders of the defined types were placed in this encounter.   Return if symptoms worsen or fail to improve.    Windell Norfolk, MD 01/22/2021, 1:21 PM  [facility] [address] [place], [place] [address] [phone]

## 2021-01-22 NOTE — Patient Instructions (Signed)
Follow-up with your primary care doctor.  Return if worse. °

## 2022-09-02 ENCOUNTER — Other Ambulatory Visit: Payer: Self-pay | Admitting: Internal Medicine

## 2022-09-02 DIAGNOSIS — E7800 Pure hypercholesterolemia, unspecified: Secondary | ICD-10-CM

## 2022-10-14 ENCOUNTER — Ambulatory Visit
Admission: RE | Admit: 2022-10-14 | Discharge: 2022-10-14 | Disposition: A | Discharge status: Home / Self Care | Payer: No Typology Code available for payment source | Source: Ambulatory Visit | Attending: Internal Medicine | Admitting: Internal Medicine

## 2022-10-14 DIAGNOSIS — E7800 Pure hypercholesterolemia, unspecified: Secondary | ICD-10-CM

## 2023-04-06 IMAGING — CT CT HEAD W/O CM
4 series · 17 of 47 positions shown, 19 images · non-contrast
Comparison: None

CLINICAL DATA: Neurological deficit acute suspected stroke,
forehead sparing facial weakness, heaviness of tongue, RIGHT eye
blurred vision, onset of symptoms yesterday

EXAM:
CT HEAD WITHOUT CONTRAST
TECHNIQUE: Contiguous axial images were obtained from the base of the skull
through the vertex without intravenous contrast. Sagittal and
coronal MPR images reconstructed from axial data set.

[Series 3: head without · axial · non-contrast · 0.42mm/px · z∈[+1097,+1217]mm · 7 of 33 slices shown, 9 images]
[im 5/33  brain]
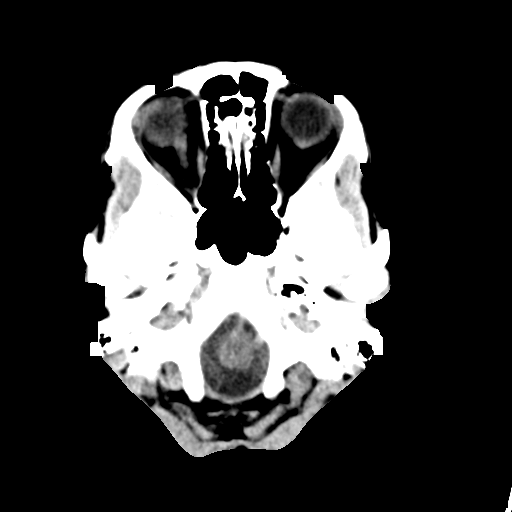
[im 5/33  bone]
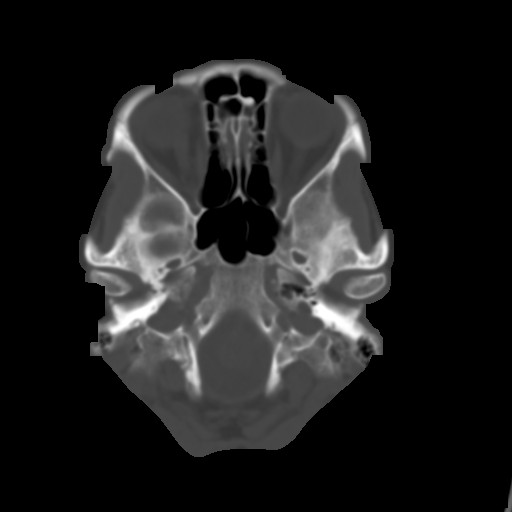
[im 9/33  brain]
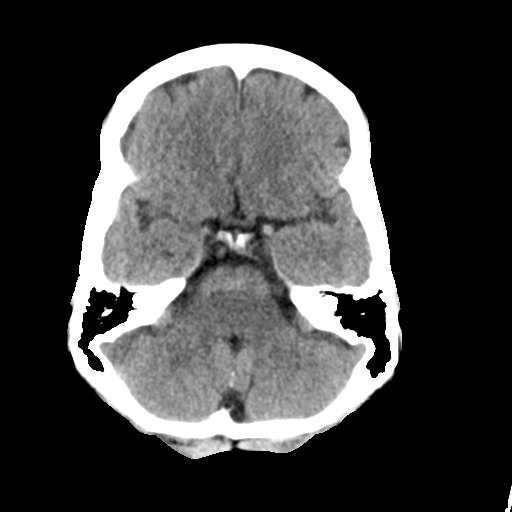
[im 13/33  brain]
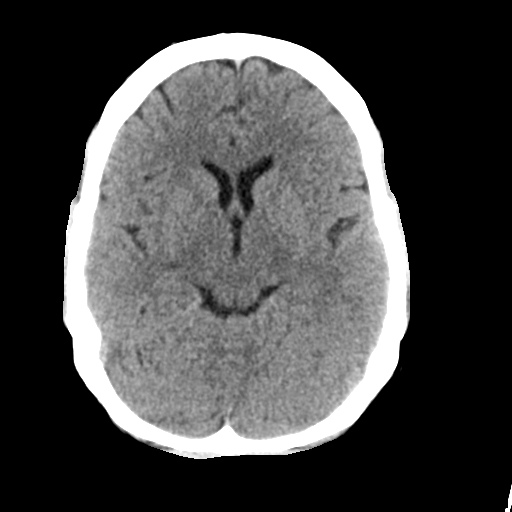
[im 17/33  brain]
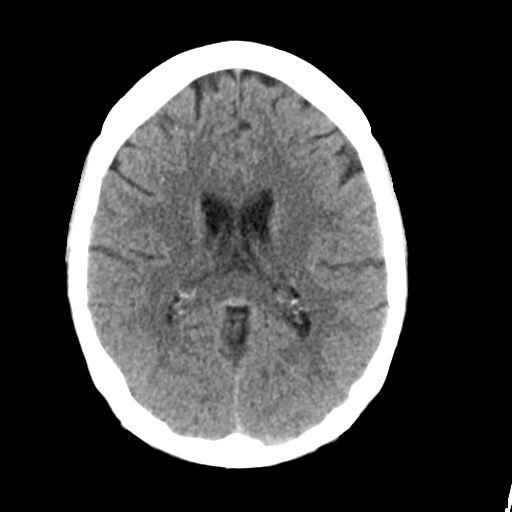
[im 21/33  brain]
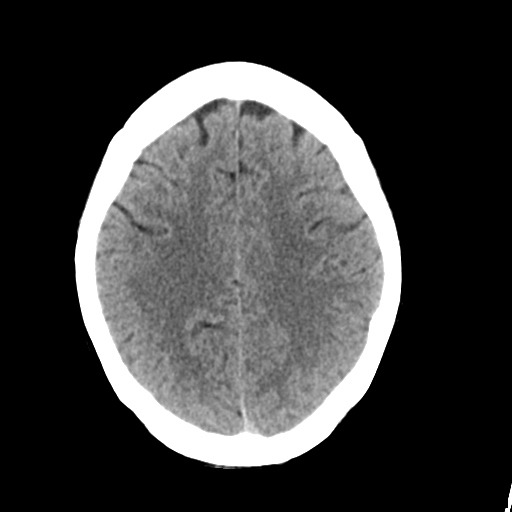
[im 21/33  bone]
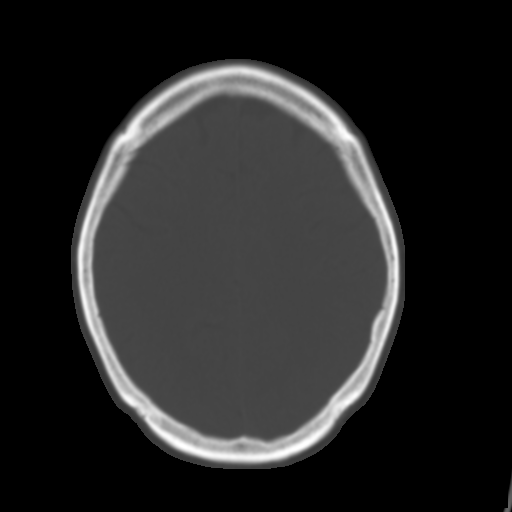
[im 25/33  brain]
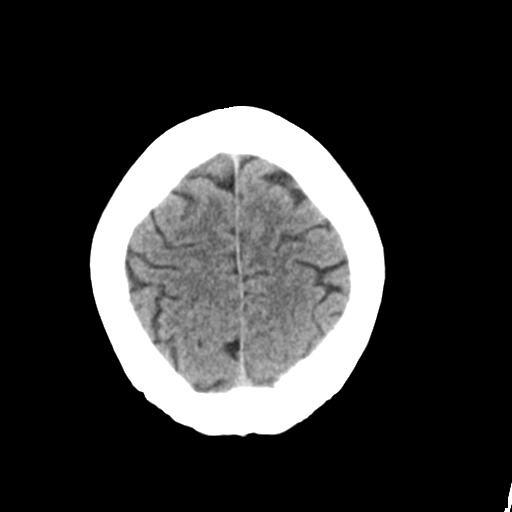
[im 29/33  brain]
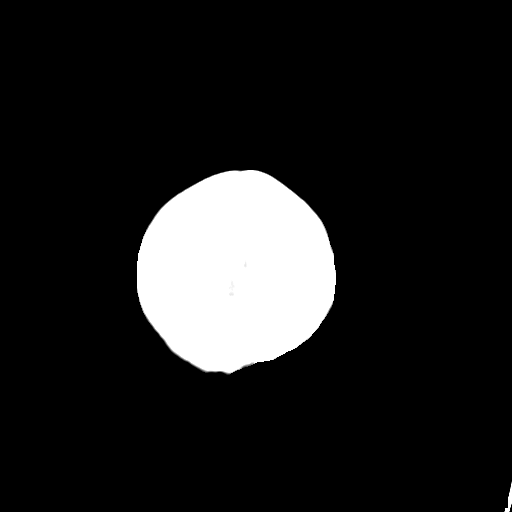

[Series 4: head bone · axial · 0.42mm/px · z∈[+1091,+1149]mm · 4 of 80 slices shown]
[im 8/80  bone]
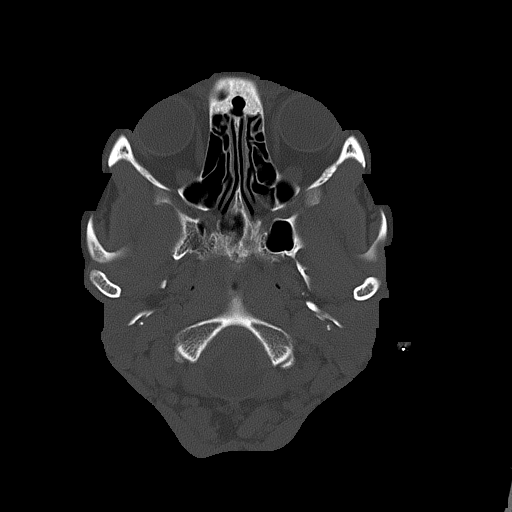
[im 16/80  bone]
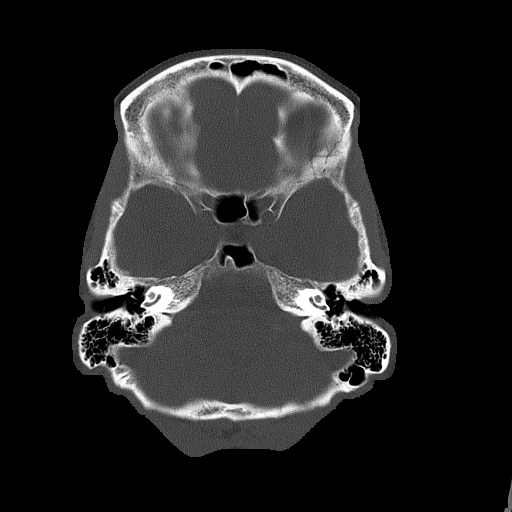
[im 24/80  bone]
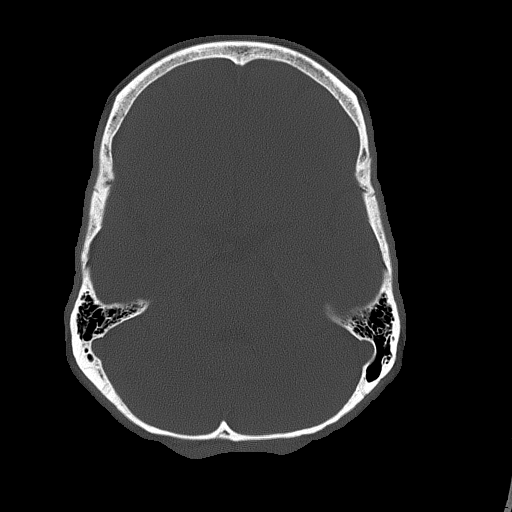
[im 36/80  bone]
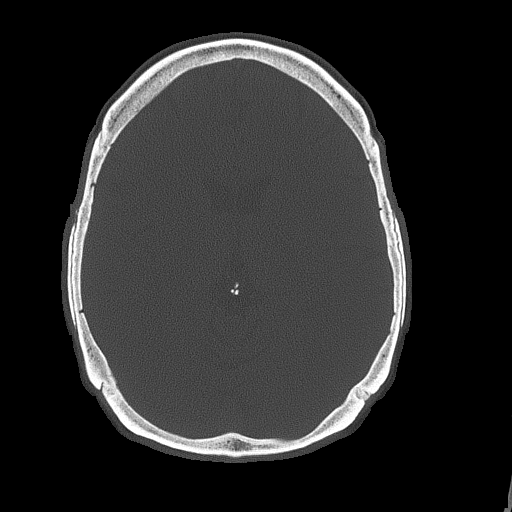

[Series 5: head without cor · coronal · non-contrast · 0.33mm/px · 3 of 73 slices shown]
[im 25/73  brain]
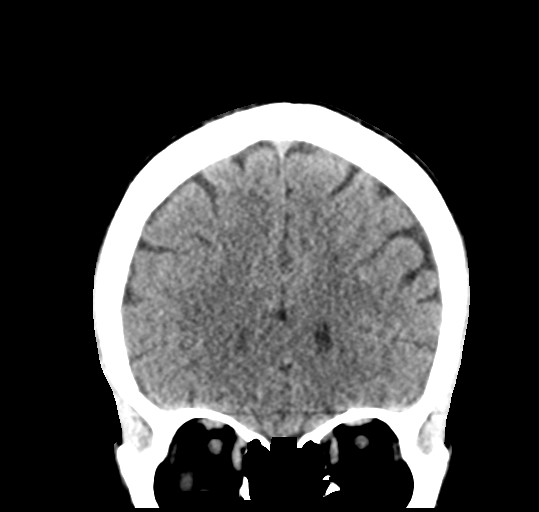
[im 33/73  brain]
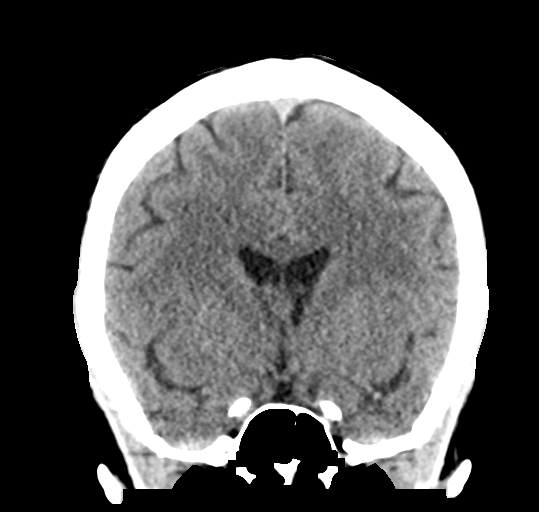
[im 41/73  brain]
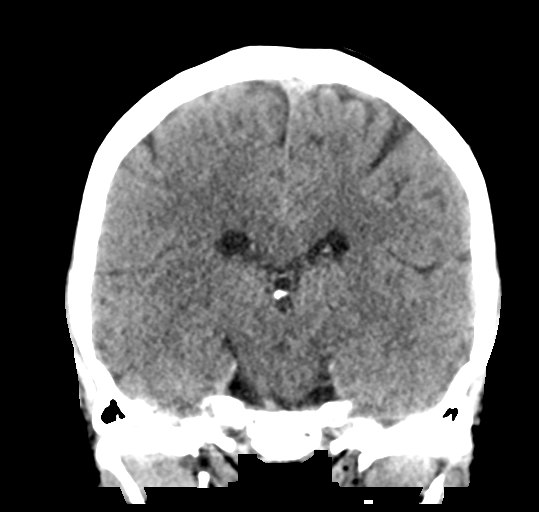

[Series 6: head without sag · sagittal · non-contrast · 0.34mm/px · 3 of 62 slices shown]
[im 21/62  brain]
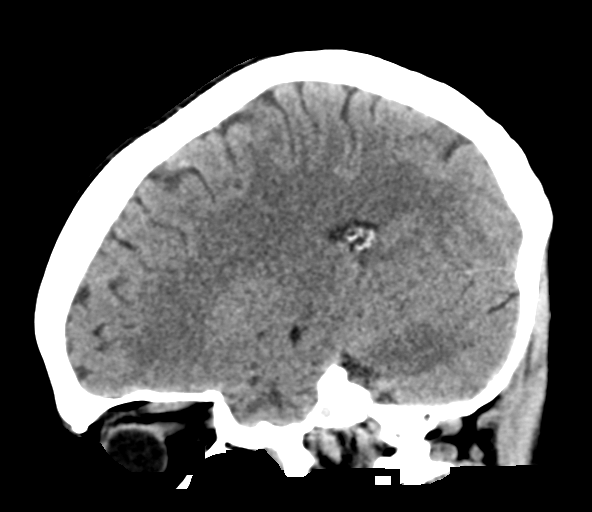
[im 31/62  brain]
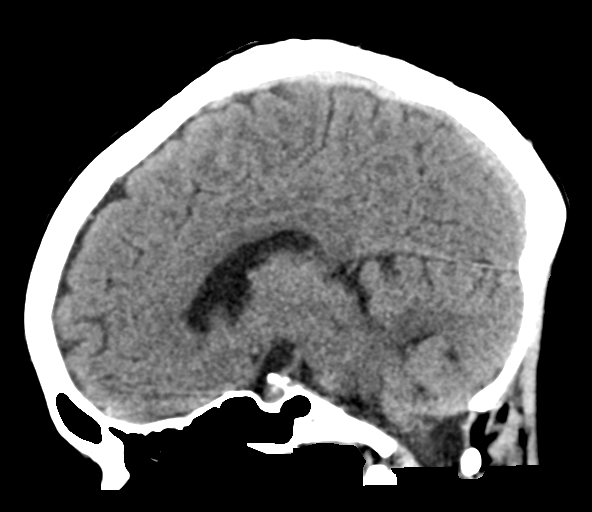
[im 41/62  brain]
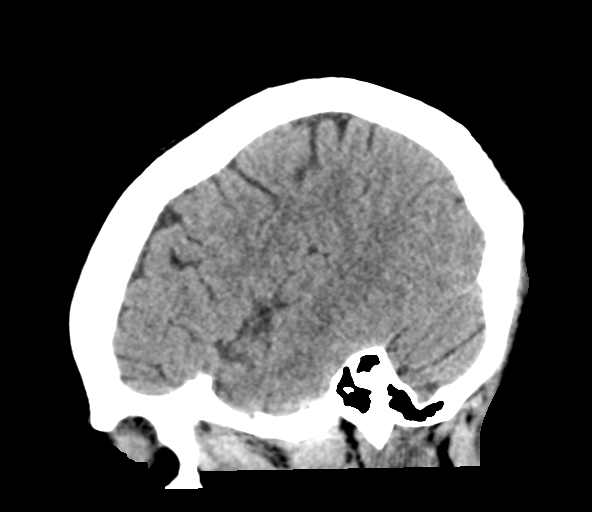

[17 of 47 positions shown; findings below may reference images not displayed]

FINDINGS: Brain: Normal ventricular morphology. No midline shift or mass
effect. Normal appearance of brain parenchyma. No intracranial
hemorrhage, mass lesion, evidence of acute infarction, or
extra-axial fluid collection.

Vascular: No hyperdense vessels

Skull: Intact

Sinuses/Orbits: Clear

Other: N/A
IMPRESSION: Normal exam.
